# Patient Record
Sex: Male | Born: 1965 | Hispanic: No | Marital: Single | Smoking: Current every day smoker
Health system: Southern US, Community
[De-identification: ages and names within clinical notes are randomized; demographics above are authoritative.]

---

## 2007-08-24 ENCOUNTER — Emergency Department (HOSPITAL_COMMUNITY): Admission: EM | Admit: 2007-08-24 | Discharge: 2007-08-24 | Payer: Self-pay | Admitting: Emergency Medicine

## 2014-11-24 ENCOUNTER — Encounter (HOSPITAL_COMMUNITY): Payer: Self-pay | Admitting: *Deleted

## 2014-11-24 ENCOUNTER — Emergency Department (HOSPITAL_COMMUNITY): Payer: BLUE CROSS/BLUE SHIELD

## 2014-11-24 ENCOUNTER — Emergency Department (HOSPITAL_COMMUNITY)
Admission: EM | Admit: 2014-11-24 | Discharge: 2014-11-24 | Disposition: A | Discharge status: Home / Self Care | Payer: BLUE CROSS/BLUE SHIELD | Attending: Emergency Medicine | Admitting: Emergency Medicine

## 2014-11-24 DIAGNOSIS — R1084 Generalized abdominal pain: Secondary | ICD-10-CM | POA: Diagnosis present

## 2014-11-24 DIAGNOSIS — Z720 Tobacco use: Secondary | ICD-10-CM | POA: Insufficient documentation

## 2014-11-24 DIAGNOSIS — N200 Calculus of kidney: Secondary | ICD-10-CM | POA: Insufficient documentation

## 2014-11-24 DIAGNOSIS — N390 Urinary tract infection, site not specified: Secondary | ICD-10-CM | POA: Diagnosis not present

## 2014-11-24 DIAGNOSIS — R109 Unspecified abdominal pain: Secondary | ICD-10-CM

## 2014-11-24 LAB — URINALYSIS, ROUTINE W REFLEX MICROSCOPIC
GLUCOSE, UA: NEGATIVE mg/dL
KETONES UR: 15 mg/dL — AB
NITRITE: POSITIVE — AB
PROTEIN: 100 mg/dL — AB
SPECIFIC GRAVITY, URINE: 1.038 — AB (ref 1.005–1.030)
UROBILINOGEN UA: 1 mg/dL (ref 0.0–1.0)
pH: 5.5 (ref 5.0–8.0)

## 2014-11-24 LAB — URINE MICROSCOPIC-ADD ON

## 2014-11-24 LAB — COMPREHENSIVE METABOLIC PANEL
ALK PHOS: 58 U/L (ref 38–126)
ALT: 19 U/L (ref 17–63)
AST: 22 U/L (ref 15–41)
Albumin: 4.2 g/dL (ref 3.5–5.0)
Anion gap: 9 (ref 5–15)
BILIRUBIN TOTAL: 0.8 mg/dL (ref 0.3–1.2)
BUN: 13 mg/dL (ref 6–20)
CALCIUM: 9 mg/dL (ref 8.9–10.3)
CHLORIDE: 106 mmol/L (ref 101–111)
CO2: 22 mmol/L (ref 22–32)
Creatinine, Ser: 1.14 mg/dL (ref 0.61–1.24)
GFR calc non Af Amer: >60 mL/min (ref >60)
GLUCOSE: 99 mg/dL (ref 65–99)
Potassium: 3.9 mmol/L (ref 3.5–5.1)
SODIUM: 137 mmol/L (ref 135–145)
TOTAL PROTEIN: 7.5 g/dL (ref 6.5–8.1)

## 2014-11-24 LAB — CBC
HEMATOCRIT: 45 % (ref 39.0–52.0)
HEMOGLOBIN: 15.2 g/dL (ref 13.0–17.0)
MCH: 28.4 pg (ref 26.0–34.0)
MCHC: 33.8 g/dL (ref 30.0–36.0)
MCV: 84.1 fL (ref 78.0–100.0)
PLATELETS: 282 10*3/uL (ref 150–400)
RBC: 5.35 MIL/uL (ref 4.22–5.81)
RDW: 13 % (ref 11.5–15.5)
WBC: 11.1 10*3/uL — AB (ref 4.0–10.5)

## 2014-11-24 LAB — LIPASE, BLOOD: Lipase: 19 U/L — ABNORMAL LOW (ref 22–51)

## 2014-11-24 MED ORDER — ONDANSETRON HCL 4 MG/2ML IJ SOLN
4.0000 mg | Freq: Once | INTRAMUSCULAR | Status: AC
  Administered 2014-11-24: 4 mg via INTRAVENOUS
  Filled 2014-11-24: qty 2

## 2014-11-24 MED ORDER — TAMSULOSIN HCL 0.4 MG PO CAPS: 0.4000 mg | ORAL_CAPSULE | Freq: Every day | ORAL | Status: AC

## 2014-11-24 MED ORDER — SODIUM CHLORIDE 0.9 % IV SOLN
1000.0000 mL | Freq: Once | INTRAVENOUS | Status: AC
  Administered 2014-11-24: 1000 mL via INTRAVENOUS

## 2014-11-24 MED ORDER — OXYCODONE-ACETAMINOPHEN 5-325 MG PO TABS: 1.0000 | ORAL_TABLET | Freq: Four times a day (QID) | ORAL | Status: DC | PRN

## 2014-11-24 MED ORDER — CEPHALEXIN 250 MG PO CAPS
500.0000 mg | ORAL_CAPSULE | Freq: Once | ORAL | Status: DC
  Filled 2014-11-24: qty 2

## 2014-11-24 MED ORDER — OXYCODONE-ACETAMINOPHEN 5-325 MG PO TABS
1.0000 | ORAL_TABLET | Freq: Once | ORAL | Status: AC
  Administered 2014-11-24: 1 via ORAL
  Filled 2014-11-24: qty 1

## 2014-11-24 MED ORDER — FENTANYL CITRATE (PF) 100 MCG/2ML IJ SOLN
100.0000 ug | Freq: Once | INTRAMUSCULAR | Status: AC
  Administered 2014-11-24: 100 ug via INTRAVENOUS
  Filled 2014-11-24: qty 2

## 2014-11-24 MED ORDER — ONDANSETRON 4 MG PO TBDP: ORAL_TABLET | ORAL | Status: DC

## 2014-11-24 NOTE — ED Provider Notes (Signed)
CSN: [id number]     Arrival date & time 11/24/14  1032 History   First MD Initiated Contact with Patient 11/24/14 1124     Chief Complaint  Patient presents with  . Abdominal Pain     (Consider location/radiation/quality/duration/timing/severity/associated sxs/prior Treatment) Patient is a 49 y.o. male presenting with abdominal pain.  Abdominal Pain Pain location:  Generalized Pain quality: aching   Pain radiates to:  Groin Pain severity:  Mild Duration:  1 day Timing:  Constant Progression:  Worsening Chronicity:  New Context: not awakening from sleep, not diet changes, not eating, not recent illness, not retching and not trauma   Associated symptoms: nausea   Associated symptoms: no chest pain, no chills and no fever    49 year old male here with 1 day of worsening lower abdominal pain. Also has some lower back pain. Some nausea no vomiting. No hematuria or dysuria. Had something similar approximately 10 years ago but it improved on its own without medical treatment in [place]. No other associated symptoms.  History reviewed. No pertinent past medical history. History reviewed. No pertinent past surgical history. History reviewed. No pertinent family history. History  Substance Use Topics  . Smoking status: Current Every Day Smoker    Types: Cigarettes  . Smokeless tobacco: Not on file  . Alcohol Use: No    Review of Systems  Constitutional: Negative for fever and chills.  Cardiovascular: Negative for chest pain.  Gastrointestinal: Positive for nausea and abdominal pain.  Musculoskeletal: Positive for back pain (lower).      Allergies  Review of patient's allergies indicates no known allergies.  Home Medications   Prior to Admission medications   Medication Sig Start Date End Date Taking? Authorizing Provider  ondansetron (ZOFRAN ODT) 4 MG disintegrating tablet  ODT q4 hours prn nausea/vomit 11/24/14   Marily Memos, MD  oxyCODONE-acetaminophen  (PERCOCET/ROXICET) 5-325 MG per tablet Take 1 tablet by mouth every 6 (six) hours as needed for severe pain. 11/24/14   Marily Memos, MD  tamsulosin (FLOMAX) 0.4 MG CAPS capsule Take 1 capsule (0.4 mg total) by mouth daily. 11/24/14 12/08/14  Marily Memos, MD   BP 116/76 mmHg  Pulse 52  Temp(Src) 98 F (36.7 C) (Oral)  Resp 16  SpO2 99% Physical Exam  Constitutional: He is oriented to person, place, and time. He appears well-developed and well-nourished.  Initially patient laying on his abdomen with a family member rubbing his back.  HENT:  Head: Normocephalic and atraumatic.  Eyes: Conjunctivae and EOM are normal.  Neck: Normal range of motion. Neck supple.  Cardiovascular: Normal rate and regular rhythm.   Pulmonary/Chest: Effort normal. No respiratory distress.  Abdominal: Soft. There is no tenderness.  Musculoskeletal: Normal range of motion. He exhibits no edema or tenderness.  Neurological: He is alert and oriented to person, place, and time.  Skin: Skin is warm and dry.  Nursing note and vitals reviewed.   ED Course  Procedures (including critical care time) Labs Review Labs Reviewed  URINALYSIS, ROUTINE W REFLEX MICROSCOPIC (NOT AT [facility]) - Abnormal; Notable for the following:    Color, Urine AMBER (*)    APPearance TURBID (*)    Specific Gravity, Urine 1.038 (*)    Hgb urine dipstick LARGE (*)    Bilirubin Urine MODERATE (*)    Ketones, ur 15 (*)    Protein, ur 100 (*)    Nitrite POSITIVE (*)    Leukocytes, UA MODERATE (*)    All other components within normal limits  CBC - Abnormal; Notable for the following:    WBC 11.1 (*)    All other components within normal limits  LIPASE, BLOOD - Abnormal; Notable for the following:    Lipase 19 (*)    All other components within normal limits  URINE MICROSCOPIC-ADD ON - Abnormal; Notable for the following:    Squamous Epithelial / LPF FEW (*)    Bacteria, UA FEW (*)    Crystals CA OXALATE CRYSTALS (*)    All other  components within normal limits  URINE CULTURE  COMPREHENSIVE METABOLIC PANEL    Imaging Review Ct Renal Stone Study  11/24/2014   CLINICAL DATA:  Midline abdominal pain  EXAM: CT ABDOMEN AND PELVIS WITHOUT CONTRAST  TECHNIQUE: Multidetector CT imaging of the abdomen and pelvis was performed following the standard protocol without IV contrast.  COMPARISON:  None.  FINDINGS: Lung bases are free of acute infiltrate or sizable effusion.  The liver, gallbladder, spleen, adrenal glands and pancreas are within normal limits. Kidneys are well visualized bilaterally. No renal calculi are noted. Mild fullness of the left renal collecting system and proximal left ureter is noted. This extends to the distal ureter where a a 2 mm stone is noted causing the obstructive changes. This is best seen on image number 70 of series 2. The bladder is partially distended.  The appendix is within normal limits. No lymphadenopathy is seen. No pelvic mass lesion is noted. No bony abnormality is seen.  IMPRESSION: Distal left ureteral stone measuring 2 mm with mild obstructive changes.   Electronically Signed   By: Alcide Clever M.D.   On: 11/24/2014 14:49     EKG Interpretation None      MDM   Final diagnoses:  Abdominal pain  Nephrolithiasis  UTI (lower urinary tract infection)   49 year old male with lower abdominal pain and bilateral left worse than right lower back pain. Concern for pyelo versus kidney stone. Neuro been diagnosed with kidney stones so we will CT scan to ensure no other emergent causes for his pain. I will treat him symptomatically in the meantime.  CT scan 2 mm UPJ stone. Urinalysis with few bacteria but nitrite positive and 6-12 leukocytes. Also has some epithelial cells a feeling this is a contaminant. Patient's not febrile or having urinary symptoms besides hematuria so I will choose not treat her or not. Spoke with urology on call, or Laverle Patter, he stated the patient needed follow-up within a  week, pain control, Flomax. Patient pain controlled at time of discharge and tolerated by mouth medications.  I have personally and contemperaneously reviewed labs and imaging and used in my decision making as above.   A medical screening exam was performed and I feel the patient has had an appropriate workup for their chief complaint at this time and likelihood of emergent condition existing is low. They have been counseled on decision, discharge, follow up and which symptoms necessitate immediate return to the emergency department. They or their family verbally stated understanding and agreement with plan and discharged in stable condition.    Marily Memos, MD 11/24/14 [id number]

## 2014-11-24 NOTE — ED Notes (Signed)
Pt reports onset of generalized abd pain yesterday. Having diarrhea. Denies n/v.

## 2014-11-26 LAB — URINE CULTURE

## 2014-12-15 ENCOUNTER — Emergency Department (HOSPITAL_COMMUNITY)
Admission: EM | Admit: 2014-12-15 | Discharge: 2014-12-15 | Disposition: A | Discharge status: Home / Self Care | Payer: BLUE CROSS/BLUE SHIELD | Attending: Emergency Medicine | Admitting: Emergency Medicine

## 2014-12-15 ENCOUNTER — Encounter (HOSPITAL_COMMUNITY): Payer: Self-pay | Admitting: *Deleted

## 2014-12-15 DIAGNOSIS — R1032 Left lower quadrant pain: Secondary | ICD-10-CM | POA: Diagnosis present

## 2014-12-15 DIAGNOSIS — N200 Calculus of kidney: Secondary | ICD-10-CM | POA: Insufficient documentation

## 2014-12-15 DIAGNOSIS — Z720 Tobacco use: Secondary | ICD-10-CM | POA: Insufficient documentation

## 2014-12-15 LAB — CBC WITH DIFFERENTIAL/PLATELET
BASOS ABS: 0.1 10*3/uL (ref 0.0–0.1)
Basophils Relative: 1 % (ref 0–1)
Eosinophils Absolute: 0.3 10*3/uL (ref 0.0–0.7)
Eosinophils Relative: 3 % (ref 0–5)
HEMATOCRIT: 43.3 % (ref 39.0–52.0)
HEMOGLOBIN: 14.6 g/dL (ref 13.0–17.0)
LYMPHS PCT: 36 % (ref 12–46)
Lymphs Abs: 3 10*3/uL (ref 0.7–4.0)
MCH: 28 pg (ref 26.0–34.0)
MCHC: 33.7 g/dL (ref 30.0–36.0)
MCV: 83.1 fL (ref 78.0–100.0)
MONO ABS: 0.5 10*3/uL (ref 0.1–1.0)
Monocytes Relative: 6 % (ref 3–12)
NEUTROS ABS: 4.6 10*3/uL (ref 1.7–7.7)
Neutrophils Relative %: 54 % (ref 43–77)
Platelets: 266 10*3/uL (ref 150–400)
RBC: 5.21 MIL/uL (ref 4.22–5.81)
RDW: 12.9 % (ref 11.5–15.5)
WBC: 8.4 10*3/uL (ref 4.0–10.5)

## 2014-12-15 LAB — BASIC METABOLIC PANEL
ANION GAP: 6 (ref 5–15)
BUN: 12 mg/dL (ref 6–20)
CHLORIDE: 105 mmol/L (ref 101–111)
CO2: 27 mmol/L (ref 22–32)
Calcium: 9 mg/dL (ref 8.9–10.3)
Creatinine, Ser: 0.89 mg/dL (ref 0.61–1.24)
GFR calc Af Amer: >60 mL/min (ref >60)
GFR calc non Af Amer: >60 mL/min (ref >60)
GLUCOSE: 88 mg/dL (ref 65–99)
POTASSIUM: 4.9 mmol/L (ref 3.5–5.1)
Sodium: 138 mmol/L (ref 135–145)

## 2014-12-15 LAB — URINE MICROSCOPIC-ADD ON

## 2014-12-15 LAB — URINALYSIS, ROUTINE W REFLEX MICROSCOPIC
BILIRUBIN URINE: NEGATIVE
Glucose, UA: NEGATIVE mg/dL
Ketones, ur: NEGATIVE mg/dL
Nitrite: NEGATIVE
Protein, ur: NEGATIVE mg/dL
SPECIFIC GRAVITY, URINE: 1.018 (ref 1.005–1.030)
UROBILINOGEN UA: 0.2 mg/dL (ref 0.0–1.0)
pH: 6 (ref 5.0–8.0)

## 2014-12-15 MED ORDER — SODIUM CHLORIDE 0.9 % IV SOLN
INTRAVENOUS | Status: DC
  Administered 2014-12-15: 15:00:00 via INTRAVENOUS

## 2014-12-15 MED ORDER — ONDANSETRON 4 MG PO TBDP: ORAL_TABLET | ORAL | Status: AC

## 2014-12-15 MED ORDER — TAMSULOSIN HCL 0.4 MG PO CAPS
0.4000 mg | ORAL_CAPSULE | Freq: Once | ORAL | Status: AC
  Administered 2014-12-15: 0.4 mg via ORAL
  Filled 2014-12-15: qty 1

## 2014-12-15 MED ORDER — OXYCODONE-ACETAMINOPHEN 5-325 MG PO TABS: 1.0000 | ORAL_TABLET | Freq: Once | ORAL | Status: DC

## 2014-12-15 MED ORDER — OXYCODONE-ACETAMINOPHEN 5-325 MG PO TABS: 1.0000 | ORAL_TABLET | Freq: Four times a day (QID) | ORAL | Status: AC | PRN

## 2014-12-15 MED ORDER — ONDANSETRON HCL 4 MG/2ML IJ SOLN
4.0000 mg | Freq: Once | INTRAMUSCULAR | Status: DC
  Filled 2014-12-15: qty 2

## 2014-12-15 MED ORDER — MORPHINE SULFATE (PF) 4 MG/ML IV SOLN
4.0000 mg | Freq: Once | INTRAVENOUS | Status: AC
  Administered 2014-12-15: 4 mg via INTRAVENOUS

## 2014-12-15 MED ORDER — MORPHINE SULFATE (PF) 4 MG/ML IV SOLN
4.0000 mg | Freq: Once | INTRAVENOUS | Status: DC
  Filled 2014-12-15: qty 1

## 2014-12-15 MED ORDER — ONDANSETRON 4 MG PO TBDP: 4.0000 mg | ORAL_TABLET | Freq: Once | ORAL | Status: DC

## 2014-12-15 MED ORDER — TAMSULOSIN HCL 0.4 MG PO CAPS: 0.4000 mg | ORAL_CAPSULE | Freq: Every day | ORAL | Status: DC

## 2014-12-15 MED ORDER — SODIUM CHLORIDE 0.9 % IV SOLN: INTRAVENOUS | Status: DC

## 2014-12-15 MED ORDER — ONDANSETRON HCL 4 MG/2ML IJ SOLN
4.0000 mg | Freq: Once | INTRAMUSCULAR | Status: AC
  Administered 2014-12-15: 4 mg via INTRAVENOUS

## 2014-12-15 NOTE — ED Provider Notes (Signed)
CSN: [id number]     Arrival date & time 12/15/14  1221 History   First MD Initiated Contact with Patient 12/15/14 1432     Chief Complaint  Patient presents with  . Flank Pain     (Consider location/radiation/quality/duration/timing/severity/associated sxs/prior Treatment) HPI    PCP: No primary care provider on file. Blood pressure 115/72, pulse 75, temperature 98 F (36.7 C), temperature source Oral, resp. rate 16, weight 134 lb 8 oz (61.009 kg), SpO2 99 %.  Dustin Lang is a 49 y.o.male with a significant PMH of kidney stone presents to the ER with complaints of pain and decreased urination. The patient was seen on 11/24/2014 and diagnosed with a left UPJ 2 mm stone.  He was supposed to follow-up with Urology in 1 week but due to a language barrier the patient did not follow-up.  Given rx for percocet and zofran. Patient is not feeling better having decreased stream with continued left flank and left lower quadrant pain.  The patient denies diaphoresis, fever, headache, weakness (general or focal), confusion, change of vision,  neck pain, dysphagia, aphagia, chest pain, shortness of breath, abdominal pains, nausea, vomiting, diarrhea, lower extremity swelling, rash.   History reviewed. No pertinent past medical history. History reviewed. No pertinent past surgical history. No family history on file. Social History  Substance Use Topics  . Smoking status: Current Every Day Smoker    Types: Cigarettes  . Smokeless tobacco: None  . Alcohol Use: No    Review of Systems  10 Systems reviewed and are negative for acute change except as noted in the HPI.    Allergies  Review of patient's allergies indicates no known allergies.  Home Medications   Prior to Admission medications   Medication Sig Start Date End Date Taking? Authorizing Provider  ondansetron (ZOFRAN ODT) 4 MG disintegrating tablet  ODT q4 hours prn nausea/vomit 12/15/14   Marlon Pel, PA-C    oxyCODONE-acetaminophen (PERCOCET/ROXICET) 5-325 MG per tablet Take 1 tablet by mouth every 6 (six) hours as needed for severe pain. 12/15/14   Marlon Pel, PA-C  tamsulosin (FLOMAX) 0.4 MG CAPS capsule Take 1 capsule (0.4 mg total) by mouth daily. 12/15/14   Kenzlei Runions Neva Seat, PA-C   BP 104/72 mmHg  Pulse 61  Temp(Src) 98 F (36.7 C) (Oral)  Resp 18  Wt 134 lb 8 oz (61.009 kg)  SpO2 99% Physical Exam  Constitutional: He appears well-developed and well-nourished. No distress.  HENT:  Head: Normocephalic and atraumatic.  Eyes: Pupils are equal, round, and reactive to light.  Neck: Normal range of motion. Neck supple.  Cardiovascular: Normal rate and regular rhythm.   Pulmonary/Chest: Effort normal.  Abdominal: Soft. Bowel sounds are normal. He exhibits no distension. There is tenderness in the left lower quadrant. There is CVA tenderness (left). There is no rigidity, no rebound and no guarding.    Neurological: He is alert.  Skin: Skin is warm and dry.  Nursing note and vitals reviewed.   ED Course  Procedures (including critical care time) Labs Review Labs Reviewed  URINALYSIS, ROUTINE W REFLEX MICROSCOPIC (NOT AT [facility]) - Abnormal; Notable for the following:    Hgb urine dipstick TRACE (*)    Leukocytes, UA TRACE (*)    All other components within normal limits  URINE CULTURE  URINE MICROSCOPIC-ADD ON  CBC WITH DIFFERENTIAL/PLATELET  BASIC METABOLIC PANEL    Imaging Review No results found. I have personally reviewed and evaluated these images and lab results as part of my  medical decision-making.   EKG Interpretation None      MDM   Final diagnoses:  Kidney stone    Urinalysis shows trace leuks and trace hemoglobin, it appears to look better than 2 weeks ago when he was here. CBC and BMP ordered - results show no abnormalities.  I discussed the case with Dr. Lynelle Doctor, J. The pt most likely has a persistent stone - does not recommend repeat imaging at this time.  Urinalysis does not show UTI and is improved from previous lab- urine culture will be sent out. Rx: percocer, zofran and flomax. Discussed with patient and family members that he needs to call the Urology office for follow-up.  Medications  tamsulosin (FLOMAX) capsule 0.4 mg (not administered)  0.9 %  sodium chloride infusion ( Intravenous New Bag/Given 12/15/14 1520)  morphine 4 MG/ML injection 4 mg (4 mg Intravenous Given 12/15/14 1520)  ondansetron (ZOFRAN) injection 4 mg (4 mg Intravenous Given 12/15/14 1520)    49 y.o.[name]'s evaluation in the Emergency Department is complete. It has been determined that no acute conditions requiring further emergency intervention are present at this time. The patient/guardian have been advised of the diagnosis and plan. We have discussed signs and symptoms that warrant return to the ED, such as changes or worsening in symptoms.  Vital signs are stable at discharge. Filed Vitals:   12/15/14 1532  BP: 104/72  Pulse: 61  Temp:   Resp: 18    Patient/guardian has voiced understanding and agreed to follow-up with the PCP or specialist.       Marlon Pel, PA-C 12/15/14 1535  Linwood Dibbles, MD 12/15/14 1544

## 2014-12-15 NOTE — ED Notes (Signed)
Pt reports that he was seen for a kidney stone 2 weeks ago. Pt reports that pain continues on the left side. Reports decreased urination.

## 2014-12-16 ENCOUNTER — Other Ambulatory Visit: Payer: Self-pay | Admitting: Urology

## 2014-12-16 ENCOUNTER — Observation Stay (HOSPITAL_COMMUNITY)
Admission: AD | Admit: 2014-12-16 | Discharge: 2014-12-17 | Disposition: A | Discharge status: Home / Self Care | Payer: BLUE CROSS/BLUE SHIELD | Source: Ambulatory Visit | Attending: Urology | Admitting: Urology

## 2014-12-16 ENCOUNTER — Encounter (HOSPITAL_COMMUNITY): Payer: Self-pay | Admitting: *Deleted

## 2014-12-16 DIAGNOSIS — N201 Calculus of ureter: Secondary | ICD-10-CM | POA: Diagnosis present

## 2014-12-16 DIAGNOSIS — F17210 Nicotine dependence, cigarettes, uncomplicated: Secondary | ICD-10-CM | POA: Insufficient documentation

## 2014-12-16 DIAGNOSIS — Z79891 Long term (current) use of opiate analgesic: Secondary | ICD-10-CM | POA: Diagnosis not present

## 2014-12-16 DIAGNOSIS — N132 Hydronephrosis with renal and ureteral calculous obstruction: Principal | ICD-10-CM | POA: Insufficient documentation

## 2014-12-16 DIAGNOSIS — Z79899 Other long term (current) drug therapy: Secondary | ICD-10-CM | POA: Insufficient documentation

## 2014-12-16 DIAGNOSIS — R109 Unspecified abdominal pain: Secondary | ICD-10-CM | POA: Diagnosis present

## 2014-12-16 MED ORDER — DIPHENHYDRAMINE HCL 50 MG/ML IJ SOLN: 12.5000 mg | Freq: Four times a day (QID) | INTRAMUSCULAR | Status: DC | PRN

## 2014-12-16 MED ORDER — OXYCODONE-ACETAMINOPHEN 5-325 MG PO TABS
1.0000 | ORAL_TABLET | ORAL | Status: DC | PRN
  Administered 2014-12-16: 2 via ORAL
  Administered 2014-12-16: 1 via ORAL
  Filled 2014-12-16: qty 1
  Filled 2014-12-16: qty 2

## 2014-12-16 MED ORDER — BELLADONNA ALKALOIDS-OPIUM 16.2-60 MG RE SUPP
1.0000 | Freq: Four times a day (QID) | RECTAL | Status: DC | PRN
  Administered 2014-12-17: 1 via RECTAL

## 2014-12-16 MED ORDER — ACETAMINOPHEN 325 MG PO TABS: 650.0000 mg | ORAL_TABLET | ORAL | Status: DC | PRN

## 2014-12-16 MED ORDER — DIPHENHYDRAMINE HCL 12.5 MG/5ML PO ELIX: 12.5000 mg | ORAL_SOLUTION | Freq: Four times a day (QID) | ORAL | Status: DC | PRN

## 2014-12-16 MED ORDER — ONDANSETRON HCL 4 MG/2ML IJ SOLN: 4.0000 mg | INTRAMUSCULAR | Status: DC | PRN

## 2014-12-16 MED ORDER — HYDROMORPHONE HCL 1 MG/ML IJ SOLN
0.5000 mg | INTRAMUSCULAR | Status: DC | PRN
  Administered 2014-12-17: 0.5 mg via INTRAVENOUS
  Filled 2014-12-16: qty 1

## 2014-12-16 MED ORDER — MAGNESIUM CITRATE PO SOLN: 1.0000 | Freq: Once | ORAL | Status: DC | PRN

## 2014-12-16 MED ORDER — BISACODYL 10 MG RE SUPP: 10.0000 mg | Freq: Every day | RECTAL | Status: DC | PRN

## 2014-12-16 MED ORDER — ZOLPIDEM TARTRATE 5 MG PO TABS: 5.0000 mg | ORAL_TABLET | Freq: Every evening | ORAL | Status: DC | PRN

## 2014-12-16 MED ORDER — SODIUM CHLORIDE 0.9 % IV SOLN
INTRAVENOUS | Status: DC
  Administered 2014-12-16 – 2014-12-17 (×2): via INTRAVENOUS

## 2014-12-16 NOTE — H&P (Signed)
Urology Admission H&P  Chief Complaint: left flank pain  History of Present Illness: Dustin Lang is a 49yo with a known left dital ureteral stone who has failed MET. Dustin Lang presented to my office with persistent severe left flank pain and inability to tolerate liquids for 72 hours. Dustin Lang denies fevers/chills/sweats  No past medical history on file. No past surgical history on file.  Home Medications:  Prescriptions prior to admission  Medication Sig Dispense Refill Last Dose  . ondansetron (ZOFRAN ODT) 4 MG disintegrating tablet 73m ODT q4 hours prn nausea/vomit 4 tablet 0   . oxyCODONE-acetaminophen (PERCOCET/ROXICET) 5-325 MG per tablet Take 1 tablet by mouth every 6 (six) hours as needed for severe pain. 30 tablet 0   . tamsulosin (FLOMAX) 0.4 MG CAPS capsule Take 1 capsule (0.4 mg total) by mouth daily. 30 capsule 0    Allergies: No Known Allergies  No family history on file. Social History:  reports that Dustin Lang has been smoking Cigarettes.  Dustin Lang does not have any smokeless tobacco history on file. Dustin Lang reports that Dustin Lang does not drink alcohol or use illicit drugs.  Review of Systems  Gastrointestinal: Positive for nausea and vomiting.  Genitourinary: Positive for dysuria, urgency, frequency and flank pain.  All other systems reviewed and are negative.   Physical Exam:  Vital signs in last 24 hours: Temp:  [97.7 F (36.5 C)] 97.7 F (36.5 C) (08/18 1700) Pulse Rate:  [69] 69 (08/18 1700) Resp:  [18] 18 (08/18 1700) BP: (116)/(69) 116/69 mmHg (08/18 1700) SpO2:  [100 %] 100 % (08/18 1700) Weight:  [60.419 kg (133 lb 3.2 oz)] 60.419 kg (133 lb 3.2 oz) (08/18 1700) Physical Exam  Constitutional: Dustin Lang is oriented to person, place, and time. Dustin Lang appears well-developed and well-nourished.  HENT:  Head: Normocephalic and atraumatic.  Eyes: EOM are normal. Pupils are equal, round, and reactive to light.  Neck: Normal range of motion. Neck supple.  Cardiovascular: Normal rate and regular rhythm.    Respiratory: Effort normal and breath sounds normal.  GI: Soft. Dustin Lang exhibits no distension.  Musculoskeletal: Normal range of motion.  Neurological: Dustin Lang is alert and oriented to person, place, and time.  Skin: Skin is warm and dry.  Psychiatric: Dustin Lang has a normal mood and affect. His behavior is normal. Judgment and thought content normal.    Laboratory Data:  No results found for this or any previous visit (from the past 24 hour(s)). Recent Results (from the past 240 hour(s))  Urine culture     Status: None (Preliminary result)   Collection Time: 12/15/14 12:45 PM  Result Value Ref Range Status   Specimen Description URINE, RANDOM  Final   Special Requests NONE  Final   Culture CULTURE REINCUBATED FOR BETTER GROWTH  Final   Report Status PENDING  Incomplete   Creatinine:  Recent Labs  12/15/14 1458  CREATININE 0.89    Impression/Assessment:  Left ureteral stone  Plan:  1. Admit for pain control and hydration 2. NPO after midnight for left stone extraction. Risks/benefits/alternatives to to left stone extraction was explained to the Dustin Lang and Dustin Lang understands and wishes to proceed with surgery  Analicia Skibinski L 12/16/2014, 7:25 PM

## 2014-12-17 ENCOUNTER — Encounter (HOSPITAL_COMMUNITY): Payer: Self-pay | Admitting: Anesthesiology

## 2014-12-17 ENCOUNTER — Observation Stay (HOSPITAL_COMMUNITY): Payer: BLUE CROSS/BLUE SHIELD | Admitting: Anesthesiology

## 2014-12-17 ENCOUNTER — Observation Stay (HOSPITAL_COMMUNITY): Payer: BLUE CROSS/BLUE SHIELD

## 2014-12-17 ENCOUNTER — Inpatient Hospital Stay (HOSPITAL_COMMUNITY): Admission: RE | Admit: 2014-12-17 | Payer: BLUE CROSS/BLUE SHIELD | Source: Ambulatory Visit | Admitting: Urology

## 2014-12-17 ENCOUNTER — Encounter (HOSPITAL_COMMUNITY)
Admission: AD | Disposition: A | Discharge status: Home / Self Care | Payer: Self-pay | Source: Ambulatory Visit | Attending: Urology

## 2014-12-17 DIAGNOSIS — N132 Hydronephrosis with renal and ureteral calculous obstruction: Secondary | ICD-10-CM | POA: Diagnosis not present

## 2014-12-17 HISTORY — PX: CYSTOSCOPY WITH RETROGRADE PYELOGRAM, URETEROSCOPY AND STENT PLACEMENT: SHX5789

## 2014-12-17 LAB — URINE CULTURE

## 2014-12-17 LAB — SURGICAL PCR SCREEN
MRSA, PCR: NEGATIVE
STAPHYLOCOCCUS AUREUS: NEGATIVE

## 2014-12-17 SURGERY — CYSTOURETEROSCOPY, WITH RETROGRADE PYELOGRAM AND STENT INSERTION
Anesthesia: General | Laterality: Left

## 2014-12-17 MED ORDER — FENTANYL CITRATE (PF) 100 MCG/2ML IJ SOLN
25.0000 ug | INTRAMUSCULAR | Status: DC | PRN
  Administered 2014-12-17 (×2): 25 ug via INTRAVENOUS

## 2014-12-17 MED ORDER — ONDANSETRON HCL 4 MG/2ML IJ SOLN
INTRAMUSCULAR | Status: DC | PRN
  Administered 2014-12-17: 4 mg via INTRAVENOUS

## 2014-12-17 MED ORDER — OXYCODONE-ACETAMINOPHEN 5-325 MG PO TABS
2.0000 | ORAL_TABLET | Freq: Once | ORAL | Status: AC
  Administered 2014-12-17: 2 via ORAL
  Filled 2014-12-17: qty 2

## 2014-12-17 MED ORDER — IOHEXOL 300 MG/ML  SOLN
INTRAMUSCULAR | Status: DC | PRN
  Administered 2014-12-17: 6 mL via INTRAVENOUS

## 2014-12-17 MED ORDER — PROMETHAZINE HCL 25 MG/ML IJ SOLN: 6.2500 mg | INTRAMUSCULAR | Status: DC | PRN

## 2014-12-17 MED ORDER — KETOROLAC TROMETHAMINE 30 MG/ML IJ SOLN
INTRAMUSCULAR | Status: DC | PRN
  Administered 2014-12-17: 30 mg via INTRAVENOUS

## 2014-12-17 MED ORDER — SODIUM CHLORIDE 0.9 % IR SOLN
Status: DC | PRN
  Administered 2014-12-17: 3000 mL via INTRAVESICAL

## 2014-12-17 MED ORDER — CEFAZOLIN SODIUM-DEXTROSE 2-3 GM-% IV SOLR
2.0000 g | INTRAVENOUS | Status: AC
  Administered 2014-12-17: 2 g via INTRAVENOUS

## 2014-12-17 MED ORDER — DEXAMETHASONE SODIUM PHOSPHATE 10 MG/ML IJ SOLN
INTRAMUSCULAR | Status: DC | PRN
  Administered 2014-12-17: 10 mg via INTRAVENOUS

## 2014-12-17 MED ORDER — OXYCODONE-ACETAMINOPHEN 5-325 MG PO TABS: 1.0000 | ORAL_TABLET | ORAL | Status: AC | PRN

## 2014-12-17 MED ORDER — LACTATED RINGERS IV SOLN
INTRAVENOUS | Status: DC | PRN
  Administered 2014-12-17: 10:00:00 via INTRAVENOUS

## 2014-12-17 MED ORDER — BELLADONNA ALKALOIDS-OPIUM 16.2-60 MG RE SUPP
RECTAL | Status: AC
  Filled 2014-12-17: qty 1

## 2014-12-17 MED ORDER — FENTANYL CITRATE (PF) 100 MCG/2ML IJ SOLN
INTRAMUSCULAR | Status: AC
  Filled 2014-12-17: qty 2

## 2014-12-17 MED ORDER — TAMSULOSIN HCL 0.4 MG PO CAPS: 0.4000 mg | ORAL_CAPSULE | Freq: Every day | ORAL | Status: AC

## 2014-12-17 MED ORDER — FENTANYL CITRATE (PF) 250 MCG/5ML IJ SOLN
INTRAMUSCULAR | Status: DC | PRN
  Administered 2014-12-17: 50 ug via INTRAVENOUS
  Administered 2014-12-17 (×3): 25 ug via INTRAVENOUS

## 2014-12-17 MED ORDER — PROPOFOL 10 MG/ML IV BOLUS
INTRAVENOUS | Status: DC | PRN
  Administered 2014-12-17: 150 mg via INTRAVENOUS

## 2014-12-17 SURGICAL SUPPLY — 15 items
BAG URO CATCHER STRL LF (DRAPE) ×3 IMPLANT
BASKET LASER NITINOL 1.9FR (BASKET) IMPLANT
BASKET STNLS GEMINI 4WIRE 3FR (BASKET) IMPLANT
CATH INTERMIT  6FR 70CM (CATHETERS) IMPLANT
EXTRACTOR STONE NITINOL NGAGE (UROLOGICAL SUPPLIES) ×3 IMPLANT
GLOVE BIO SURGEON STRL SZ8 (GLOVE) ×3 IMPLANT
GOWN STRL REUS W/TWL LRG LVL3 (GOWN DISPOSABLE) ×3 IMPLANT
GUIDEWIRE ANG ZIPWIRE 038X150 (WIRE) ×3 IMPLANT
GUIDEWIRE STR DUAL SENSOR (WIRE) ×3 IMPLANT
MANIFOLD NEPTUNE II (INSTRUMENTS) ×3 IMPLANT
PACK CYSTO (CUSTOM PROCEDURE TRAY) ×3 IMPLANT
STENT CONTOUR 6FRX26X.038 (STENTS) ×3 IMPLANT
TUBE FEEDING 8FR 16IN STR KANG (MISCELLANEOUS) IMPLANT
TUBING CONNECTING 10 (TUBING) ×2 IMPLANT
TUBING CONNECTING 10' (TUBING) ×1

## 2014-12-17 NOTE — Anesthesia Procedure Notes (Signed)
Procedure Name: LMA Insertion Date/Time: 12/17/2014 10:16 AM Performed by: Delphia Grates Pre-anesthesia Checklist: Emergency Drugs available, Suction available, Patient being monitored and Patient identified Patient Re-evaluated:Patient Re-evaluated prior to inductionOxygen Delivery Method: Circle system utilized Preoxygenation: Pre-oxygenation with 100% oxygen Intubation Type: IV induction LMA: LMA inserted LMA Size: 4.0 Number of attempts: 1 Placement Confirmation: positive ETCO2 Tube secured with: Tape Dental Injury: Teeth and Oropharynx as per pre-operative assessment  Comments: Poor dentition

## 2014-12-17 NOTE — Discharge Instructions (Signed)

## 2014-12-17 NOTE — Progress Notes (Signed)
Called and spoke with [place] interpreter via [employer] (ID [phone]).  Dr Knox Royalty MD Anesthesiologist, L Chandler,CRNA, Jillyn Hidden and  OR RN all present during questions and explanation regarding surgery and anesthesia. Patient stated he understood and had no further questions.

## 2014-12-17 NOTE — Op Note (Signed)
.  Preoperative diagnosis: Left ureteral stone  Postoperative diagnosis: Same  Procedure: 1 cystoscopy 2. Left retrograde pyelography 3.  Intraoperative fluoroscopy, under one hour, with interpretation 4.  Left ureteroscopic stone manipulation with basket extraction 5.  Left 6 x 26 JJ stent placement  Attending: Wilkie Aye  Anesthesia: General  Estimated blood loss: None  Drains: Left 6 x 26 JJ ureteral stent without tether  Specimens: stone for analysis  Antibiotics: ancef  Findings: left distal ureteral stone. mild hydronephrosis. No masses/lesions in the bladder. Ureteral orifices in normal anatomic location.  Indications: Patient is a 49 year old male with a history of left ureteral stone and who has persistent left flank pain.  After discussing treatment options, he decided proceed with left ureteroscopic stone manipulation.  Procedure her in detail: The patient was brought to the operating room and a brief timeout was done to ensure correct patient, correct procedure, correct site.  General anesthesia was administered patient was placed in dorsal lithotomy position.  His genitalia was then prepped and draped in usual sterile fashion.  A rigid 22 French cystoscope was passed in the urethra and the bladder.  Bladder was inspected free masses or lesions.  the ureteral orifices were in the normal orthotopic locations.  a 6 french ureteral catheter was then instilled into the left ureteral orifice.  a gentle retrograde was obtained and findings noted above.  we then placed a zip wire through the ureteral catheter and advanced up to the renal pelvis.  we then removed the cystoscope and cannulated the left ureteral orifice with a semirigid ureteroscope.  We encountered a stone in the distal ureter. Using an Ngage basket the stone was removed. We then reinspected the ureter and noted no residual stone. we then placed a 6 x 26 double-j ureteral stent over the original zip wire.  We then  removed the wire and good coil was noted in the the renal pelvis under fluoroscopy and the bladder under direct vision.  the bladder was then drained and this concluded the procedure which was well tolerated by patient.  Complications: None  Condition: Stable, extubated, transferred to PACU  Plan: Patient is to be discharged home as to follow-up in one week for stent removal.

## 2014-12-17 NOTE — Anesthesia Preprocedure Evaluation (Addendum)
Anesthesia Evaluation  Patient identified by MRN, date of birth, ID band Patient awake    Reviewed: Allergy & Precautions, NPO status , Patient's Chart, lab work & pertinent test results  Airway Mallampati: II  TM Distance: >3 FB Neck ROM: Full    Dental no notable dental hx.    Pulmonary Current Smoker,  breath sounds clear to auscultation  Pulmonary exam normal       Cardiovascular negative cardio ROS Normal cardiovascular examRhythm:Regular Rate:Normal     Neuro/Psych negative neurological ROS  negative psych ROS   GI/Hepatic negative GI ROS, Neg liver ROS,   Endo/Other  negative endocrine ROS  Renal/GU negative Renal ROS  negative genitourinary   Musculoskeletal negative musculoskeletal ROS (+)   Abdominal   Peds negative pediatric ROS (+)  Hematology negative hematology ROS (+)   Anesthesia Other Findings   Reproductive/Obstetrics negative OB ROS                           Anesthesia Physical Anesthesia Plan  ASA: II  Anesthesia Plan: General   Post-op Pain Management:    Induction: Intravenous  Airway Management Planned: LMA  Additional Equipment:   Intra-op Plan:   Post-operative Plan: Extubation in OR  Informed Consent: I have reviewed the patients History and Physical, chart, labs and discussed the procedure including the risks, benefits and alternatives for the proposed anesthesia with the patient or authorized representative who has indicated his/her understanding and acceptance.   Dental advisory given  Plan Discussed with: CRNA  Anesthesia Plan Comments: (Professional interpreter used on phone. Questions answered.)       Anesthesia Quick Evaluation

## 2014-12-17 NOTE — Progress Notes (Signed)
Went over all discharge information with patient and family.  Explained importance of taking medications as prescribed.  Pain Medication given.  Prescriptions and AVS summary given to patient son.  VSS.  Explained importance of following up with urologist.  PT will be wheeled out by NT.

## 2014-12-17 NOTE — Transfer of Care (Signed)
Immediate Anesthesia Transfer of Care Note  Patient: Dustin Lang  Procedure(s) Performed: Procedure(s): CYSTOSCOPY WITH RETROGRADE PYELOGRAM, URETEROSCOPY AND STENT PLACEMENT (Left)  Patient Location: PACU  Anesthesia Type:General  Level of Consciousness: awake, oriented and patient cooperative  Airway & Oxygen Therapy: Patient Spontanous Breathing and Patient connected to face mask oxygen  Post-op Assessment: Report given to RN and Post -op Vital signs reviewed and stable  Post vital signs: Reviewed and stable  Last Vitals:  Filed Vitals:   12/17/14 0446  BP: 122/67  Pulse: 58  Temp: 36.6 C  Resp: 18    Complications: No apparent anesthesia complications

## 2014-12-17 NOTE — Brief Op Note (Signed)
12/16/2014 - 12/17/2014  10:56 AM  PATIENT:  Dustin Lang  49 y.o. male  PRE-OPERATIVE DIAGNOSIS:  LEFT URETERAL STONE  POST-OPERATIVE DIAGNOSIS:  LEFT URETERAL STONE  PROCEDURE:  Procedure(s): CYSTOSCOPY WITH RETROGRADE PYELOGRAM, URETEROSCOPY AND STENT PLACEMENT (Left)  SURGEON:  Surgeon(s) and Role:    * Malen Gauze, MD - Primary  PHYSICIAN ASSISTANT:   ASSISTANTS: none   ANESTHESIA:   general  EBL:  Total I/O In: 500 [I.V.:500] Out: 150 [Urine:150]  BLOOD ADMINISTERED:none  DRAINS: 6x26 JJ ureteral stent  LOCAL MEDICATIONS USED:  NONE  SPECIMEN:  Source of Specimen:  stone  DISPOSITION OF SPECIMEN:  N/A  COUNTS:  YES  TOURNIQUET:  * No tourniquets in log *  DICTATION: .Note written in EPIC  PLAN OF CARE: Discharge to home after PACU  PATIENT DISPOSITION:  PACU - hemodynamically stable.   Delay start of Pharmacological VTE agent (>24hrs) due to surgical blood loss or risk of bleeding: not applicable

## 2014-12-17 NOTE — Anesthesia Postprocedure Evaluation (Signed)
  Anesthesia Post-op Note  Patient: Dustin Lang  Procedure(s) Performed: Procedure(s) (LRB): CYSTOSCOPY WITH RETROGRADE PYELOGRAM, URETEROSCOPY AND STENT PLACEMENT (Left)  Patient Location: PACU  Anesthesia Type: General  Level of Consciousness: awake and alert   Airway and Oxygen Therapy: Patient Spontanous Breathing  Post-op Pain: mild  Post-op Assessment: Post-op Vital signs reviewed, Patient's Cardiovascular Status Stable, Respiratory Function Stable, Patent Airway and No signs of Nausea or vomiting  Last Vitals:  Filed Vitals:   12/17/14 1218  BP: 116/67  Pulse: 59  Temp: 36.8 C  Resp: 12    Post-op Vital Signs: stable   Complications: No apparent anesthesia complications

## 2014-12-18 ENCOUNTER — Telehealth (HOSPITAL_BASED_OUTPATIENT_CLINIC_OR_DEPARTMENT_OTHER): Payer: Self-pay | Admitting: Emergency Medicine

## 2014-12-18 NOTE — Telephone Encounter (Signed)
Post ED Visit - Positive Culture Follow-up  Culture report reviewed by antimicrobial stewardship pharmacist:  Wes Dulaney, Pharm.D., BCPS  Celedonio Miyamoto, Pharm.D., BCPS  Georgina Pillion, Pharm.D., BCPS  [place], [address].D., BCPS, AAHIVP  Estella Husk, Pharm.D., BCPS, AAHIVP  Elder Cyphers, [address].D., BCPS Okey Regal PharmD  Urine culture multi species asymptomatic and no further patient follow-up is required at this time.  Berle Mull 12/18/2014, 9:30 AM

## 2014-12-20 ENCOUNTER — Encounter (HOSPITAL_COMMUNITY): Payer: Self-pay | Admitting: Urology

## 2014-12-20 NOTE — Discharge Summary (Signed)
Physician Discharge Summary  Patient ID: Dustin Lang MRN: [id number] DOB/AGE: 10-14-1965 49 y.o.  Admit date: 12/16/2014 Discharge date: 12/17/2014 Admission Diagnoses: ureteral stone  Discharge Diagnoses:  Active Problems:   Ureteral calculi   Ureteral stone   Discharged Condition: good  Hospital Course: The pt was admitted for pain control and hydration. He was then taken to the OR for stone extraction. The patient tolerated the procedure well and was transferred to the floor on IV pain meds, IV fluid. Prior to discharge the pt was tolerating a regular diet, pain was controlled on PO pain meds, they were ambulating without difficulty, and they had normal bowel function.   Consults: None  Significant Diagnostic Studies: none  Treatments: ureteroscopic stone extraction, left  Discharge Exam: Blood pressure 116/67, pulse 59, temperature 98.2 F (36.8 C), temperature source Oral, resp. rate 12, height  (1.575 m), weight 60.419 kg (133 lb 3.2 oz), SpO2 100 %. General appearance: alert, cooperative and appears stated age Head: Normocephalic, without obvious abnormality, atraumatic Eyes: conjunctivae/corneas clear. PERRL, EOM's intact. Fundi benign. Nose: Nares normal. Septum midline. Mucosa normal. No drainage or sinus tenderness. Resp: clear to auscultation bilaterally Cardio: regular rate and rhythm, S1, S2 normal, no murmur, click, rub or gallop GI: soft, non-tender; bowel sounds normal; no masses,  no organomegaly Extremities: extremities normal, atraumatic, no cyanosis or edema Skin: Skin color, texture, turgor normal. No rashes or lesions  Disposition: 01-Home or Self Care     Medication List    TAKE these medications        ondansetron 4 MG disintegrating tablet  Commonly known as:  ZOFRAN ODT   ODT q4 hours prn nausea/vomit     oxyCODONE-acetaminophen 5-325 MG per tablet  Commonly known as:  PERCOCET/ROXICET  Take 1 tablet by mouth every 6 (six) hours as  needed for severe pain.     oxyCODONE-acetaminophen 5-325 MG per tablet  Commonly known as:  PERCOCET/ROXICET  Take 1-2 tablets by mouth every 4 (four) hours as needed for moderate pain.     tamsulosin 0.4 MG Caps capsule  Commonly known as:  FLOMAX  Take 1 capsule (0.4 mg total) by mouth daily.           Follow-up Information    Follow up with Clea Dubach L, MD. Call in 1 week.   Specialty:  Urology   Why:  stent removal   Contact information:   [address] [place] [address] [phone]       Signed: Malen Gauze 12/20/2014, 11:54 AM

## 2016-10-29 IMAGING — CT CT RENAL STONE PROTOCOL
2 of 4 series · 16 of 46 positions shown, 18 images · non-contrast
Comparison: None.

CLINICAL DATA: Midline abdominal pain

EXAM:
CT ABDOMEN AND PELVIS WITHOUT CONTRAST
TECHNIQUE: Multidetector CT imaging of the abdomen and pelvis was performed
following the standard protocol without IV contrast.

[Series 2: stone study 5.0 i30f 1 · axial · 0.70mm/px · z∈[-737,-332]mm · 13 of 89 slices shown, 15 images]
[im 4/89  soft-tissue]
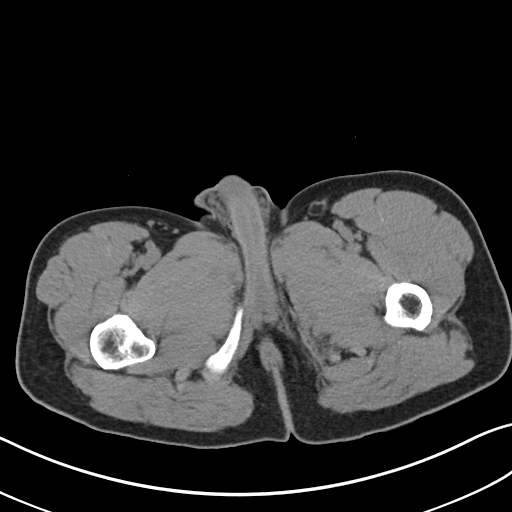
[im 4/89  bone]
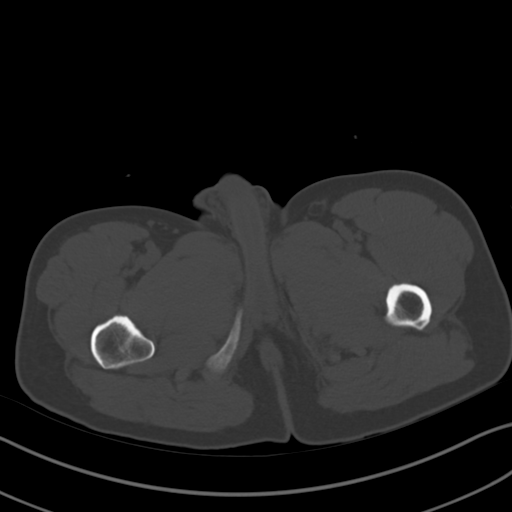
[im 11/89  soft-tissue]
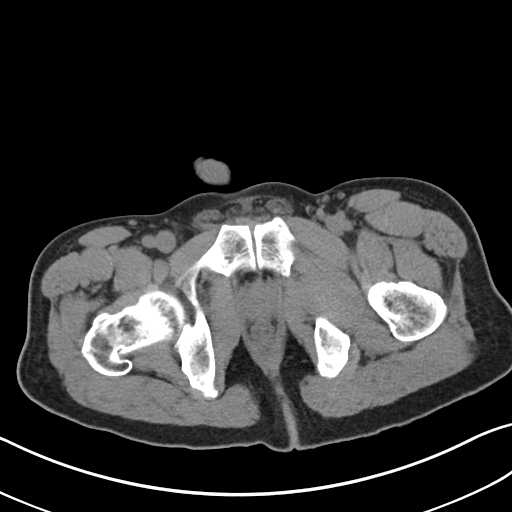
[im 18/89  soft-tissue]
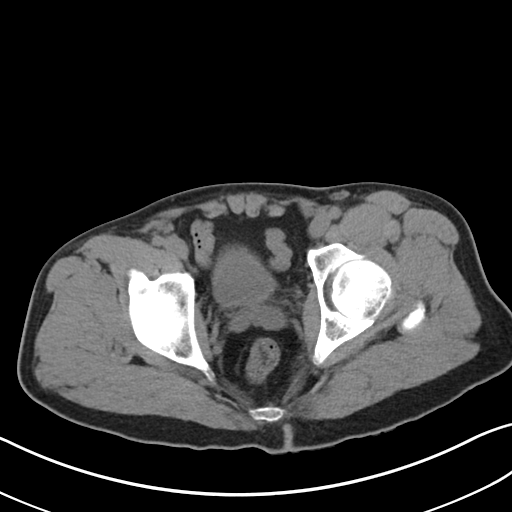
[im 25/89  soft-tissue]
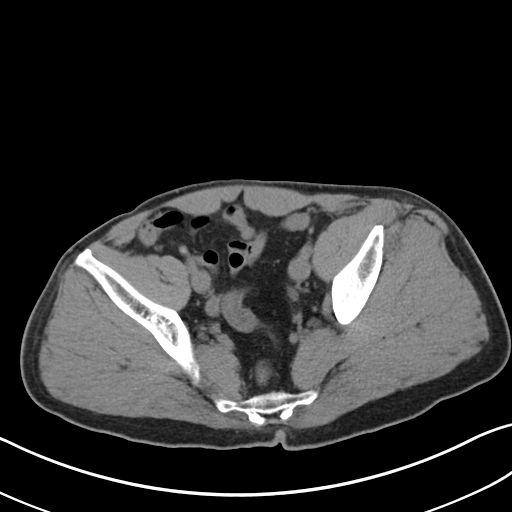
[im 32/89  soft-tissue]
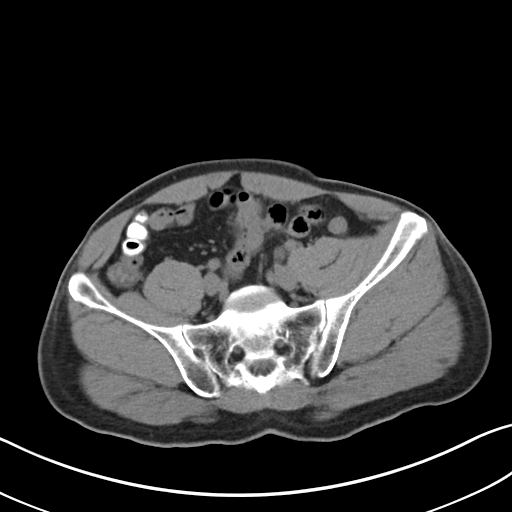
[im 39/89  soft-tissue]
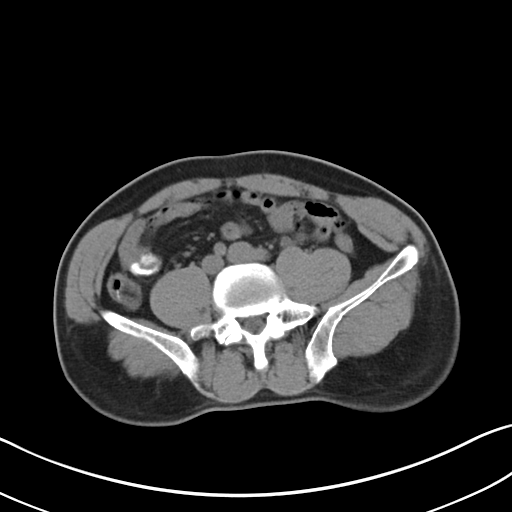
[im 46/89  soft-tissue]
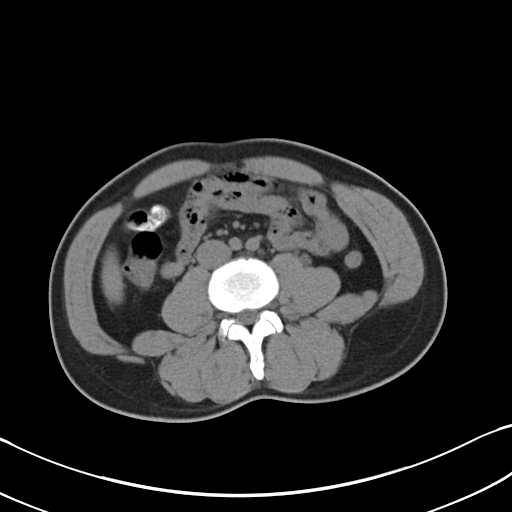
[im 50/89  soft-tissue]
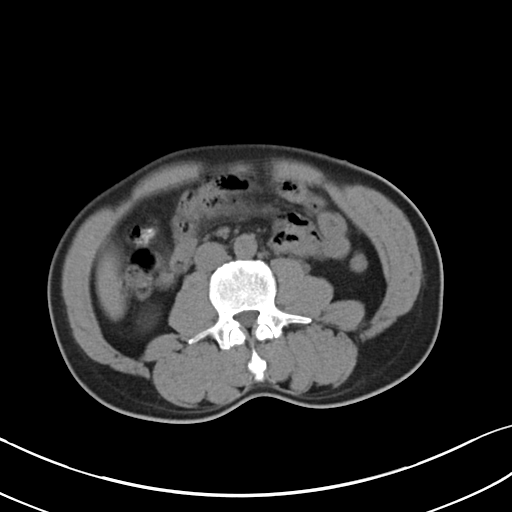
[im 57/89  soft-tissue]
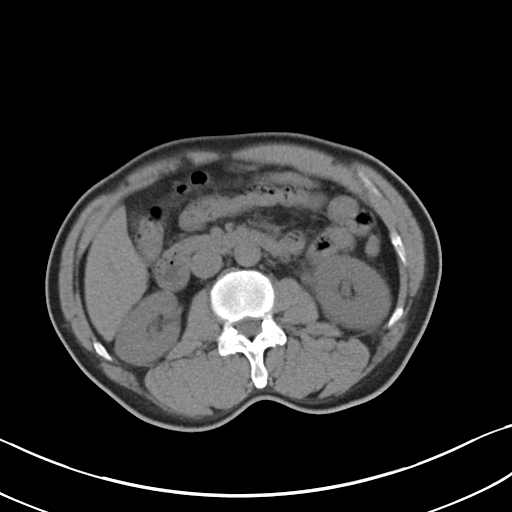
[im 57/89  bone]
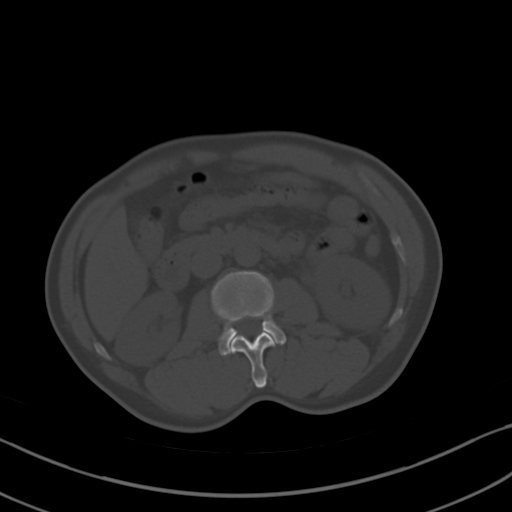
[im 64/89  soft-tissue]
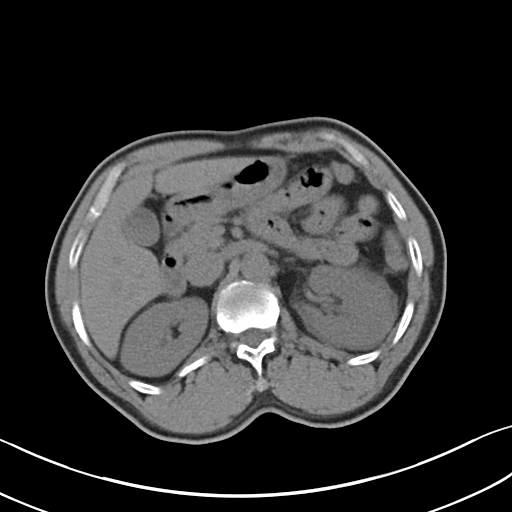
[im 71/89  soft-tissue]
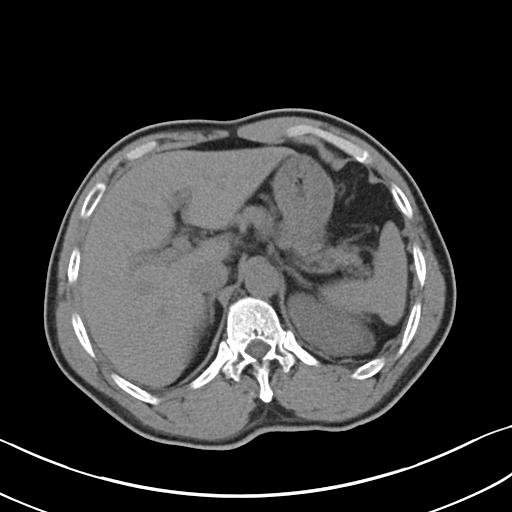
[im 78/89  soft-tissue]
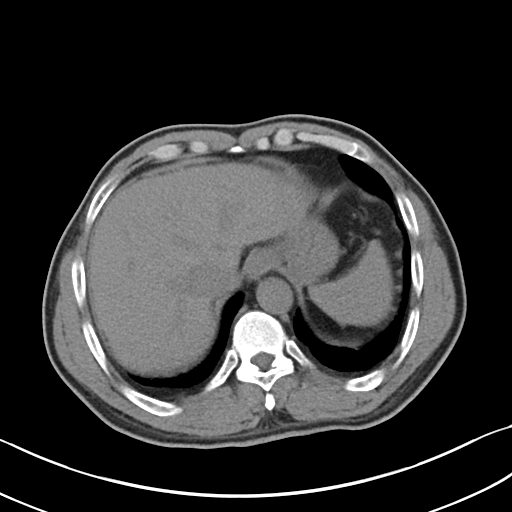
[im 85/89  soft-tissue]
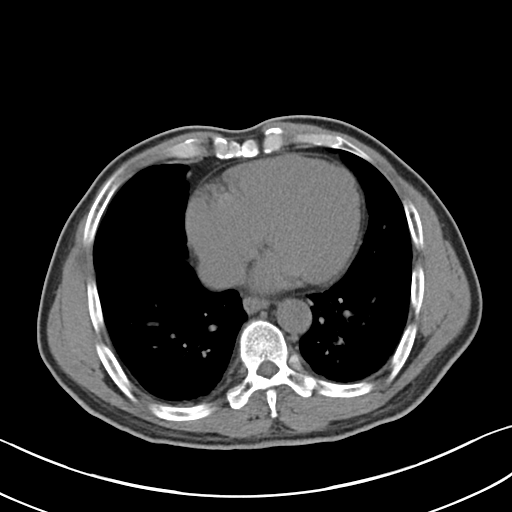

[Series 5: coronal soft tissue · coronal · 0.57mm/px · 3 of 69 slices shown]
[im 23/69  soft-tissue]
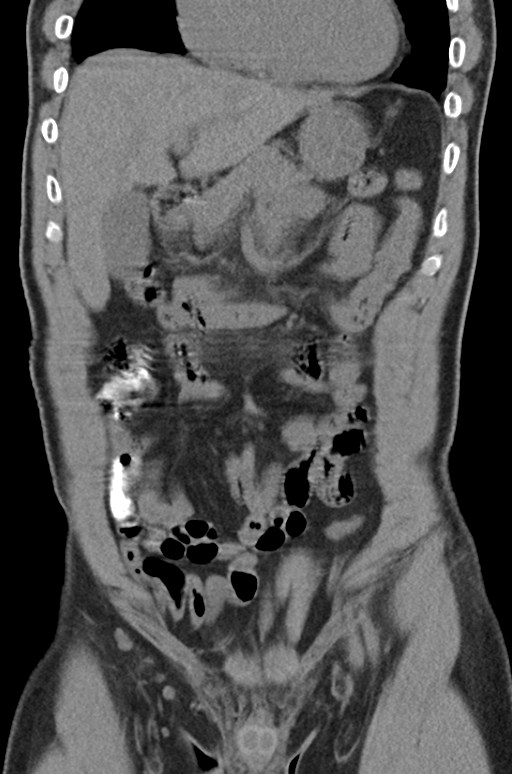
[im 31/69  soft-tissue]
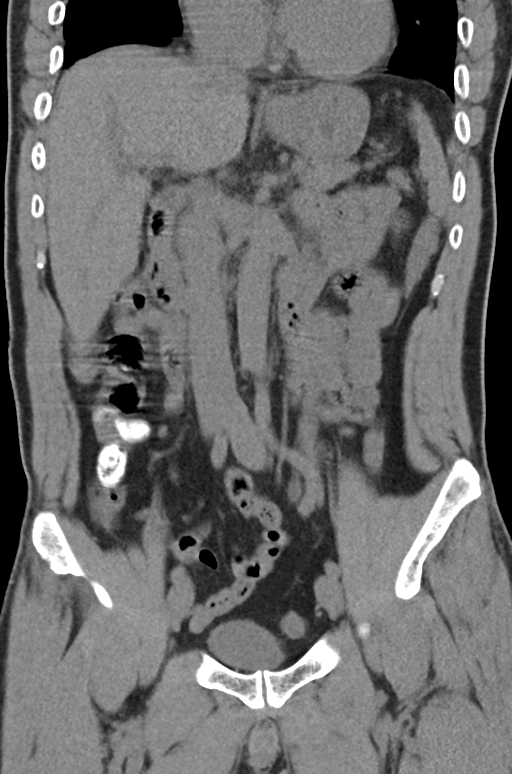
[im 38/69  soft-tissue]
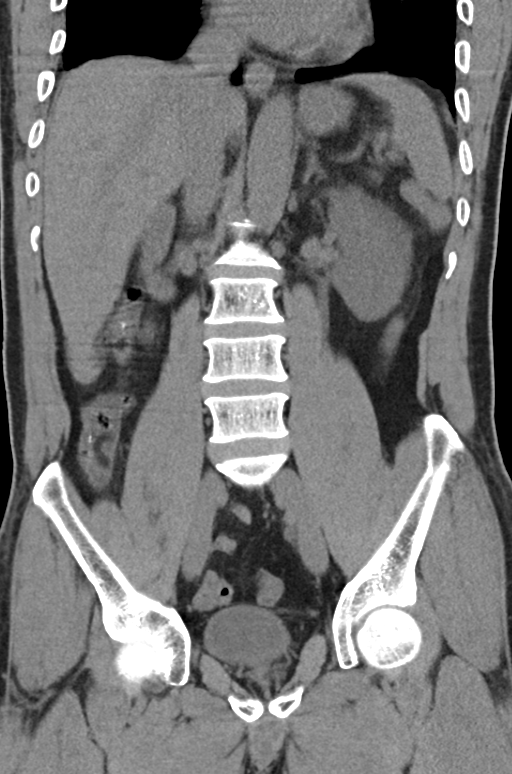

[16 of 46 positions shown; findings below may reference images not displayed]

FINDINGS: Lung bases are free of acute infiltrate or sizable effusion.

The liver, gallbladder, spleen, adrenal glands and pancreas are
within normal limits. Kidneys are well visualized bilaterally. No
renal calculi are noted. Mild fullness of the left renal collecting
system and proximal left ureter is noted. This extends to the distal
ureter where a a 2 mm stone is noted causing the obstructive
changes. This is best seen on image number 70 of series 2. The
bladder is partially distended.

The appendix is within normal limits. No lymphadenopathy is seen. No
pelvic mass lesion is noted. No bony abnormality is seen.
IMPRESSION: Distal left ureteral stone measuring 2 mm with mild obstructive
changes.

## 2016-11-21 IMAGING — RF DG C-ARM 1-60 MIN-NO REPORT
1 series · 8 of 8 positions shown · non-contrast
Comparison: CT abdomen and pelvis November 24, 2014

CLINICAL DATA: Left flank pain with intraoperative ureteral
calculus extraction

EXAM:
DG C-ARM 1-60 MIN - NRPT [facility]; RETROGRADE PYELOGRAM

[Series 1: run · 8 of 8 slices shown]
[im 1/8]
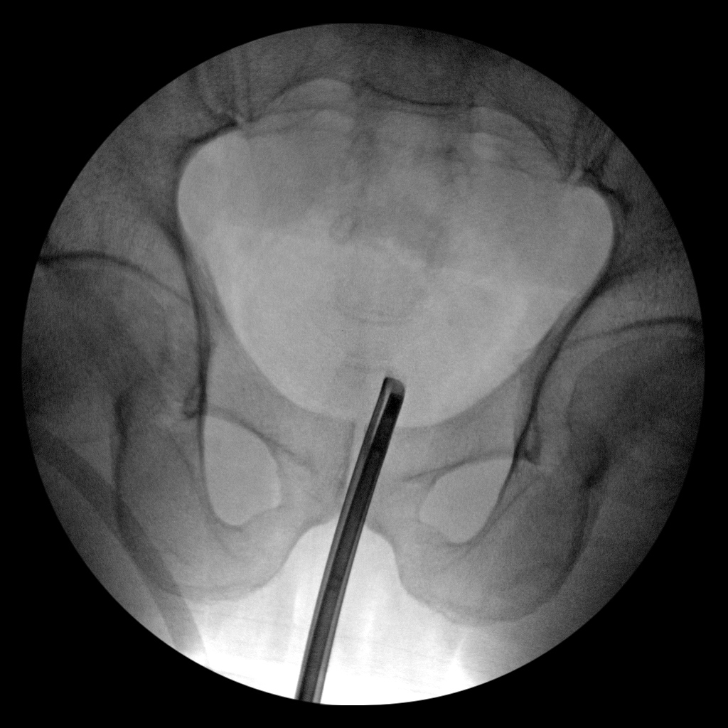
[im 2/8]
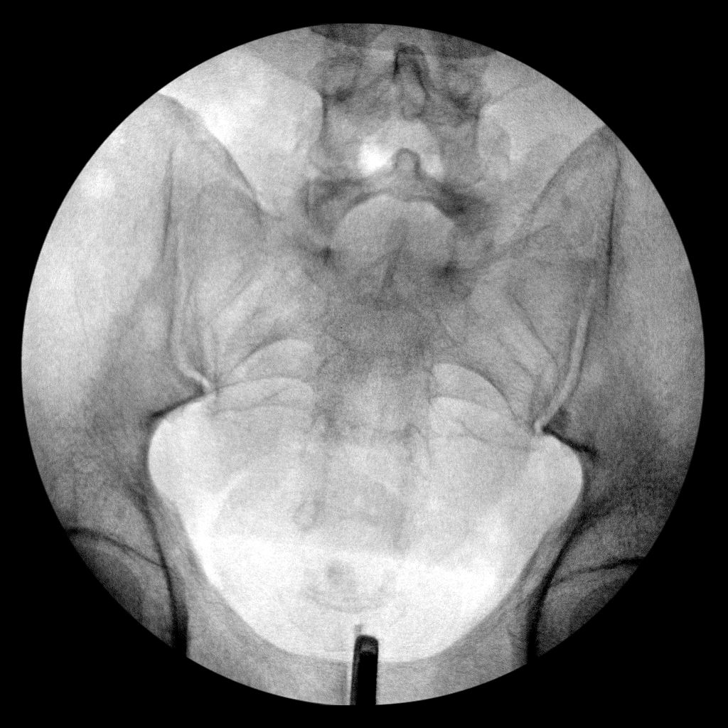
[im 3/8]
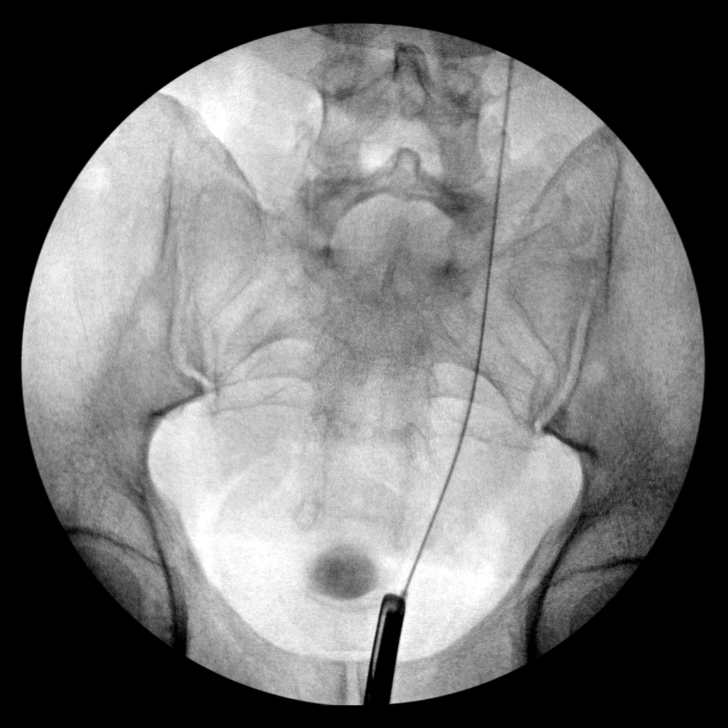
[im 4/8]
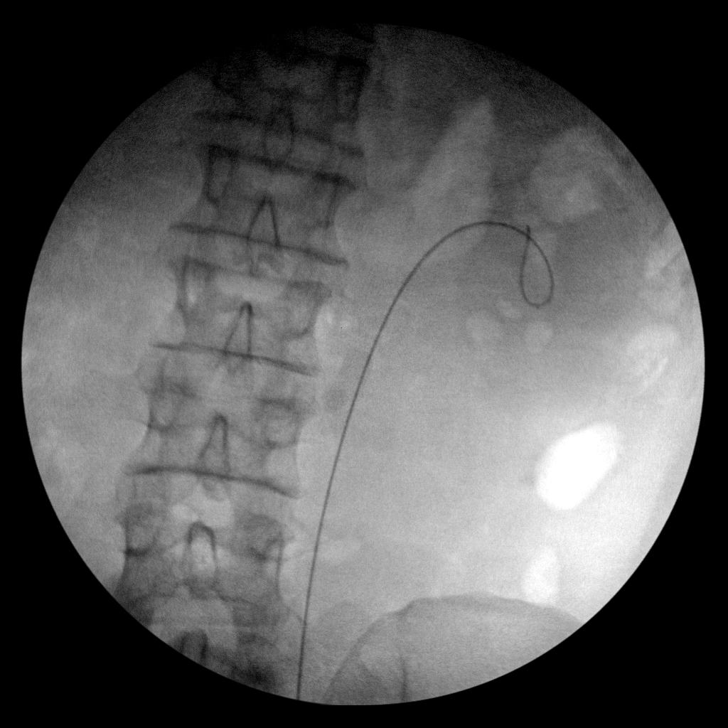
[im 5/8]
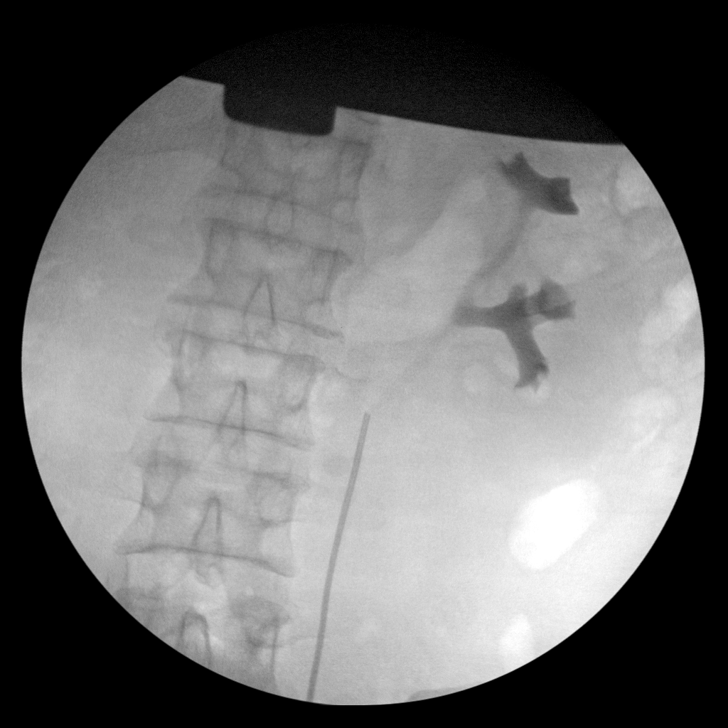
[im 6/8]
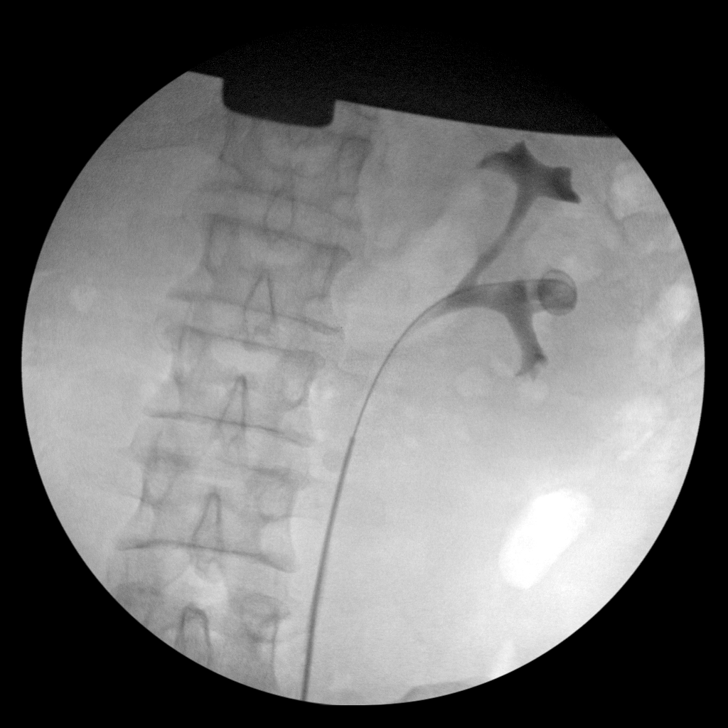
[im 7/8]
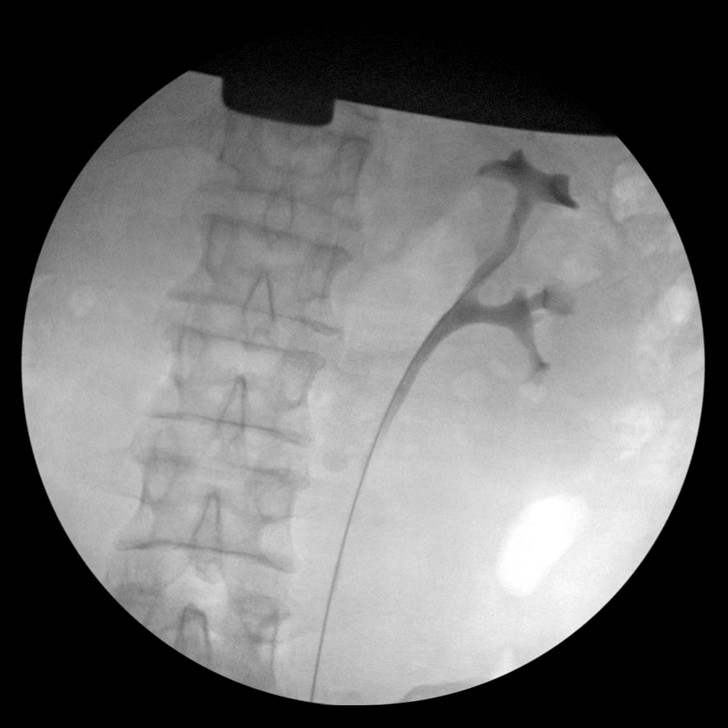
[im 8/8]
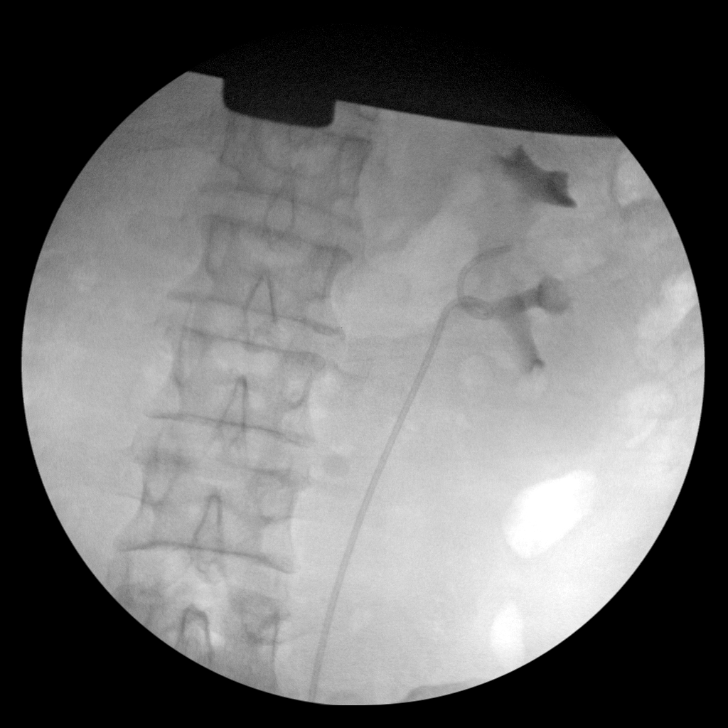

[8 of 8 positions shown; findings below may reference images not displayed]

FLUOROSCOPY TIME:  Fluoroscopy Time:  0 minutes 33 seconds

Number of Acquired Images:  8
FINDINGS: A double-J stent was placed from the level of a left mid renal calyx
to the bladder. Contrast injection demonstrate calices on the left
to be delicately cupped without appreciable filling defect. No
calculi seen on submitted images.
IMPRESSION: No filling defects seen with double-J stent noted on the left. No
appreciable caliceal dilatation on the left.
